# Patient Record
Sex: Female | Born: 2005 | Race: White | Hispanic: No | Marital: Single | Smoking: Never smoker
Health system: Southern US, Community
[De-identification: ages and names within clinical notes are randomized; demographics above are authoritative.]

## PROBLEM LIST (undated history)

## (undated) DIAGNOSIS — K589 Irritable bowel syndrome without diarrhea: Secondary | ICD-10-CM

## (undated) DIAGNOSIS — F909 Attention-deficit hyperactivity disorder, unspecified type: Secondary | ICD-10-CM

## (undated) DIAGNOSIS — T7840XA Allergy, unspecified, initial encounter: Secondary | ICD-10-CM

## (undated) DIAGNOSIS — G43909 Migraine, unspecified, not intractable, without status migrainosus: Secondary | ICD-10-CM

## (undated) DIAGNOSIS — R55 Syncope and collapse: Secondary | ICD-10-CM

## (undated) HISTORY — DX: Attention-deficit hyperactivity disorder, unspecified type: F90.9

## (undated) HISTORY — DX: Syncope and collapse: R55

## (undated) HISTORY — DX: Allergy, unspecified, initial encounter: T78.40XA

## (undated) HISTORY — DX: Irritable bowel syndrome, unspecified: K58.9

## (undated) HISTORY — DX: Irritable bowel syndrome without diarrhea: K58.9

## (undated) HISTORY — DX: Migraine, unspecified, not intractable, without status migrainosus: G43.909

---
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
## 2023-04-18 ENCOUNTER — Other Ambulatory Visit: Payer: Self-pay | Admitting: Family Medicine

## 2023-05-15 ENCOUNTER — Encounter: Payer: Self-pay | Admitting: Family Medicine

## 2023-05-15 ENCOUNTER — Telehealth: Payer: Managed Care, Other (non HMO) | Admitting: Family Medicine

## 2023-05-15 DIAGNOSIS — F902 Attention-deficit hyperactivity disorder, combined type: Secondary | ICD-10-CM

## 2023-05-15 MED ORDER — AMPHETAMINE-DEXTROAMPHET ER 20 MG PO CP24: 20.0000 mg | ORAL_CAPSULE | ORAL | 0 refills | Status: DC

## 2023-05-15 NOTE — Progress Notes (Signed)
Subjective:    Patient ID: Denise Williams, female    DOB: 2005-10-09, 17 y.o.   MRN: [id number]  Virtual Visit via Video Note  I connected with the patient on 05/15/23 at  1:30 PM EDT by a video enabled telemedicine application and verified that I am speaking with the correct person using two identifiers.  Location patient: home Location provider:work or home office Persons participating in the virtual visit: patient, provider  I discussed the limitations of evaluation and management by telemedicine and the availability of in person appointments. The patient expressed understanding and agreed to proceed.   HPI: (see below)    ROS: See pertinent positives and negatives per HPI.  Past Medical History:  Diagnosis Date   ADHD    Allergy    sees Dr. Verdis Frederickson at [facility] in [place]    Fainting episodes    resolved   IBS (irritable bowel syndrome)    Migraines     History reviewed. No pertinent surgical history.  Family History  Problem Relation Age of Onset   Asthma Other    Coronary artery disease Other    Depression Other    Diabetes Other    Hypertension Other    Allergies Other    Alcohol abuse Other    Dementia Maternal Grandfather      Current Outpatient Medications:    amphetamine-dextroamphetamine (ADDERALL XR) 20 MG 24 hr capsule, Take 1 capsule (20 mg total) by mouth every morning., Disp: 30 capsule, Rfl: 0   amphetamine-dextroamphetamine (ADDERALL XR) 20 MG 24 hr capsule, Take 1 capsule (20 mg total) by mouth every morning., Disp: 30 capsule, Rfl: 0   amphetamine-dextroamphetamine (ADDERALL XR) 20 MG 24 hr capsule, Take 1 capsule (20 mg total) by mouth every morning., Disp: 30 capsule, Rfl: 0   amitriptyline (ELAVIL) 50 MG tablet, TAKE 0.5 TABLETS BY MOUTH AT BEDTIME., Disp: 45 tablet, Rfl: 0   SUMAtriptan (IMITREX) 100 MG tablet, TAKE 1 TABLET (100 MG TOTAL) BY MOUTH AS NEEDED FOR MIGRAINE. MAY REPEAT IN 2 HOURS IF HEADACHE PERSISTS OR  RECURS., Disp: 9 tablet, Rfl: 6  EXAM:  VITALS per patient if applicable:  GENERAL: alert, oriented, appears well and in no acute distress  HEENT: atraumatic, conjunttiva clear, no obvious abnormalities on inspection of external nose and ears  NECK: normal movements of the head and neck  LUNGS: on inspection no signs of respiratory distress, breathing rate appears normal, no obvious gross SOB, gasping or wheezing  CV: no obvious cyanosis  MS: moves all visible extremities without noticeable abnormality  PSYCH/NEURO: pleasant and cooperative, no obvious depression or anxiety, speech and thought processing grossly intact  ASSESSMENT AND PLAN:  Discussed the following assessment and plan:  No diagnosis found.     I discussed the assessment and treatment plan with the patient. The patient was provided an opportunity to ask questions and all were answered. The patient agreed with the plan and demonstrated an understanding of the instructions.   The patient was advised to call back or seek an in-person evaluation if the symptoms worsen or if the condition fails to improve as anticipated.    HPI:  Here asking for help with her ADHD. 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 Her work load is much greater than it was in high school. When she was much younger she took Adderall XR for a few years, and this worked quite well for her.  Review of Systems  Constitutional: Negative.   Respiratory: Negative.    Cardiovascular: Negative.   Psychiatric/Behavioral:  Positive for decreased concentration. Negative for agitation and dysphoric mood. The patient is not nervous/anxious.        Objective:   Physical Exam Constitutional:      Appearance: Normal appearance.  Neurological:     Mental Status: She is alert.           Assessment & Plan:  ADHD. She will try Adderall XR 20 mg every morning.  Gershon Crane, MD

## 2023-07-07 ENCOUNTER — Other Ambulatory Visit: Payer: Self-pay | Admitting: Family Medicine

## 2023-07-11 ENCOUNTER — Ambulatory Visit: Payer: Managed Care, Other (non HMO) | Admitting: Family Medicine

## 2023-07-12 ENCOUNTER — Encounter: Payer: Self-pay | Admitting: Family Medicine

## 2023-07-12 ENCOUNTER — Ambulatory Visit: Payer: Managed Care, Other (non HMO) | Admitting: Family Medicine

## 2023-07-12 VITALS — BP 98/68 | HR 85 | Temp 98.3°F | Wt 150.0 lb

## 2023-07-12 DIAGNOSIS — F902 Attention-deficit hyperactivity disorder, combined type: Secondary | ICD-10-CM | POA: Diagnosis not present

## 2023-07-12 MED ORDER — AMPHETAMINE-DEXTROAMPHET ER 20 MG PO CP24: 20.0000 mg | ORAL_CAPSULE | ORAL | 0 refills | Status: DC

## 2023-07-12 MED ORDER — SUMATRIPTAN SUCCINATE 100 MG PO TABS: 100.0000 mg | ORAL_TABLET | ORAL | 11 refills | Status: AC | PRN

## 2023-07-12 NOTE — Progress Notes (Signed)
   Subjective:    Patient ID: Denise Williams, female    DOB: 29-Mar-2006, 17 y.o.   MRN: [id number]  HPI Here to follow up on ADHD. She is home on winter break from college. She has been taking Adderal XR, and this has been working well for her. She can focus on her work better, and she has no side effects. So far she is getting "straight A's".    Review of Systems  Constitutional: Negative.   Respiratory: Negative.    Cardiovascular: Negative.   Neurological: Negative.        Objective:   Physical Exam Constitutional:      Appearance: Normal appearance.  Cardiovascular:     Rate and Rhythm: Normal rate and regular rhythm.     Pulses: Normal pulses.     Heart sounds: Normal heart sounds.  Pulmonary:     Effort: Pulmonary effort is normal.     Breath sounds: Normal breath sounds.  Neurological:     Mental Status: She is alert.           Assessment & Plan:  ADHD, well controlled. She will stay on the current regimen. Gershon Crane, MD

## 2023-07-28 ENCOUNTER — Other Ambulatory Visit: Payer: Self-pay | Admitting: Family Medicine

## 2023-07-30 MED ORDER — AMITRIPTYLINE HCL 50 MG PO TABS: 50.0000 mg | ORAL_TABLET | Freq: Every day | ORAL | 0 refills | Status: DC

## 2023-09-23 ENCOUNTER — Other Ambulatory Visit: Payer: Self-pay | Admitting: Family Medicine

## 2023-09-24 MED ORDER — AMPHETAMINE-DEXTROAMPHET ER 20 MG PO CP24: 20.0000 mg | ORAL_CAPSULE | ORAL | 0 refills | Status: DC

## 2023-09-24 NOTE — Telephone Encounter (Signed)
 Done

## 2023-11-11 ENCOUNTER — Other Ambulatory Visit: Payer: Self-pay | Admitting: Family Medicine

## 2023-11-18 ENCOUNTER — Other Ambulatory Visit: Payer: Self-pay | Admitting: Family Medicine

## 2023-11-19 MED ORDER — AMITRIPTYLINE HCL 25 MG PO TABS: 25.0000 mg | ORAL_TABLET | Freq: Every day | ORAL | 3 refills | Status: AC

## 2023-11-19 NOTE — Telephone Encounter (Signed)
 Done

## 2024-04-06 ENCOUNTER — Other Ambulatory Visit: Payer: Self-pay | Admitting: Family Medicine

## 2024-04-06 NOTE — Telephone Encounter (Unsigned)
 Copied from CRM [id number]. Topic: Clinical - Medication Refill >> Apr 06, 2024 11:25 AM Thersia C wrote: Medication: amphetamine -dextroamphetamine (ADDERALL XR) 20 MG 24 hr capsule  Has the patient contacted their pharmacy? Yes (Agent: If no, request that the patient contact the pharmacy for the refill. If patient does not wish to contact the pharmacy document the reason why and proceed with request.) (Agent: If yes, when and what did the pharmacy advise?)  This is the patient's preferred pharmacy:  [facility] - [place], [place] - [address] [place] [address] Phone: [phone] Fax: [phone]  Is this the correct pharmacy for this prescription? Yes If no, delete pharmacy and type the correct one.   Has the prescription been filled recently? No  Is the patient out of the medication? Yes  Has the patient been seen for an appointment in the last year OR does the patient have an upcoming appointment? Yes  Can we respond through MyChart? Yes  Agent: Please be advised that Rx refills may take up to 3 business days. We ask that you follow-up with your pharmacy.

## 2024-04-06 NOTE — Telephone Encounter (Signed)
 09/24/23 x 3 30 tabs/0 RF

## 2024-04-09 MED ORDER — AMPHETAMINE-DEXTROAMPHET ER 20 MG PO CP24: 20.0000 mg | ORAL_CAPSULE | ORAL | 0 refills | Status: DC

## 2024-04-09 NOTE — Telephone Encounter (Signed)
 Done

## 2024-06-20 ENCOUNTER — Encounter: Payer: Self-pay | Admitting: Family Medicine

## 2024-06-23 MED ORDER — AMPHETAMINE-DEXTROAMPHET ER 20 MG PO CP24: 20.0000 mg | ORAL_CAPSULE | ORAL | 0 refills | Status: AC

## 2024-06-23 NOTE — Telephone Encounter (Signed)
 Done

## 2024-07-27 ENCOUNTER — Telehealth: Payer: Self-pay | Admitting: Family Medicine

## 2024-07-27 NOTE — Telephone Encounter (Unsigned)
 Copied from CRM #[id number]. Topic: Clinical - Refused Triage >> Jul 27, 2024 12:40 PM Jayma L wrote:   ----------------------------------------------------------------------- From previous Reason for Contact - Refused Triage: Patient/caller voiced complaints of stomach pain . Declined transfer to triage.   Said she was told to call and ask for nurse triage due to stomach pain but she doesn't want seen for that she just needs a physical. Advised she was already scheduled. Best number is [phone]

## 2024-07-28 NOTE — Telephone Encounter (Signed)
 Noted

## 2024-08-03 ENCOUNTER — Encounter: Payer: Self-pay | Admitting: Family Medicine

## 2024-08-04 ENCOUNTER — Encounter: Payer: Self-pay | Admitting: Family Medicine

## 2024-08-04 ENCOUNTER — Ambulatory Visit: Admitting: Family Medicine

## 2024-08-04 VITALS — BP 114/70 | HR 118 | Temp 98.6°F | Ht 60.5 in | Wt 148.0 lb

## 2024-08-04 DIAGNOSIS — R051 Acute cough: Secondary | ICD-10-CM

## 2024-08-04 MED ORDER — PROMETHAZINE-DM 6.25-15 MG/5ML PO SYRP: 5.0000 mL | ORAL_SOLUTION | Freq: Four times a day (QID) | ORAL | 0 refills | Status: AC | PRN

## 2024-08-04 MED ORDER — PREDNISONE 10 MG PO TABS: ORAL_TABLET | ORAL | 0 refills | Status: AC

## 2024-08-04 NOTE — Patient Instructions (Signed)
-  It was a pleasure to care for you today. -Suspect you had viral infection, such as flu, and developed this lingering cough.  -Prescribed Promethazine -DM cough syrup. Caution: Medication can cause drowsiness. -Prescribed Prednisone  10mg , 6 day taper for cough. Recommend to not take NSAIDS (Ibuprofen, Advil, Aleve, and Naproxen) while taking medication and eat something with medication.  -Follow up if not improved.

## 2024-08-04 NOTE — Progress Notes (Signed)
 "  Acute Office Visit   Subjective:  Patient ID: Denise Williams, female    DOB: March 18, 2006, 18 y.o.   MRN: [id number]  Chief Complaint  Patient presents with   Cough    HPI:  Patient is present for an acute visit. Her primary care provider is Dr. Garnette Olmsted.  Patient is complaining of a lingering cough that started since last Saturday. Cough was initially dry and become productive. Phlegm is thick green. Pain in chest related to cough and no SHOB. Denies fever.   On last Saturday she had sore throat, body aches, headaches, and fatigue. These symptoms have resolved.   She has been taking OTC cough syrup and cough drops with the first few days, but not worked the last several days.   Review of Systems  Respiratory:  Positive for cough.    See HPI above      Objective:   BP 114/70   Pulse (!) 118   Temp 98.6 F (37 C) (Oral)   Ht 5' 0.5 (1.537 m)   Wt 148 lb (67.1 kg)   SpO2 94%   BMI 28.43 kg/m    Physical Exam Vitals reviewed.  Constitutional:      General: She is not in acute distress.    Appearance: Normal appearance. She is not ill-appearing, toxic-appearing or diaphoretic.  HENT:     Head: Normocephalic and atraumatic.     Right Ear: Tympanic membrane, ear canal and external ear normal. There is no impacted cerumen.     Left Ear: Tympanic membrane, ear canal and external ear normal. There is no impacted cerumen.     Nose:     Right Sinus: No maxillary sinus tenderness or frontal sinus tenderness.     Left Sinus: No maxillary sinus tenderness or frontal sinus tenderness.     Mouth/Throat:     Pharynx: Oropharynx is clear. Uvula midline. No pharyngeal swelling, oropharyngeal exudate, posterior oropharyngeal erythema or uvula swelling.  Eyes:     General:        Right eye: No discharge.        Left eye: No discharge.     Conjunctiva/sclera: Conjunctivae normal.  Cardiovascular:     Rate and Rhythm: Normal rate and regular rhythm.     Heart sounds: Normal  heart sounds. No murmur heard.    No friction rub. No gallop.  Pulmonary:     Effort: Pulmonary effort is normal. No respiratory distress.     Breath sounds: Normal breath sounds.     Comments: Non productive during visit  Musculoskeletal:        General: Normal range of motion.  Skin:    General: Skin is warm and dry.  Neurological:     General: No focal deficit present.     Mental Status: She is alert and oriented to person, place, and time. Mental status is at baseline.  Psychiatric:        Mood and Affect: Mood normal.        Behavior: Behavior normal.        Thought Content: Thought content normal.        Judgment: Judgment normal.       Assessment & Plan:  Acute cough -     Promethazine -DM; Take 5 mLs by mouth 4 (four) times daily as needed.  Dispense: 118 mL; Refill: 0 -     predniSONE ; Take 6 tablets (60 mg total) by mouth daily with breakfast for 1 day, THEN 5  tablets (50 mg total) daily with breakfast for 1 day, THEN 4 tablets (40 mg total) daily with breakfast for 1 day, THEN 3 tablets (30 mg total) daily with breakfast for 1 day, THEN 2 tablets (20 mg total) daily with breakfast for 1 day, THEN 1 tablet (10 mg total) daily with breakfast for 1 day.  Dispense: 21 tablet; Refill: 0  -Suspect a viral infection, such as flu, and developed this lingering cough.  -Prescribed Promethazine -DM cough syrup. Caution: Medication can cause drowsiness. -Prescribed Prednisone  10mg , 6 day taper for cough. Recommend to not take NSAIDS (Ibuprofen, Advil, Aleve, and Naproxen) while taking medication and eat something with medication.  -Follow up if not improved.   Demontay Grantham, NP "

## 2024-08-08 ENCOUNTER — Ambulatory Visit
Admission: RE | Admit: 2024-08-08 | Discharge: 2024-08-08 | Disposition: A | Discharge status: Home / Self Care | Source: Ambulatory Visit | Attending: Family Medicine | Admitting: Family Medicine

## 2024-08-08 ENCOUNTER — Other Ambulatory Visit: Payer: Self-pay

## 2024-08-08 VITALS — BP 132/88 | HR 127 | Temp 98.4°F | Resp 18

## 2024-08-08 DIAGNOSIS — J209 Acute bronchitis, unspecified: Secondary | ICD-10-CM | POA: Diagnosis not present

## 2024-08-08 MED ORDER — DOXYCYCLINE HYCLATE 100 MG PO CAPS: 100.0000 mg | ORAL_CAPSULE | Freq: Two times a day (BID) | ORAL | 0 refills | Status: AC

## 2024-08-08 MED ORDER — BENZONATATE 200 MG PO CAPS: 200.0000 mg | ORAL_CAPSULE | Freq: Three times a day (TID) | ORAL | 0 refills | Status: AC | PRN

## 2024-08-08 NOTE — ED Provider Notes (Signed)
 " [facility]    CSN: [id number] Arrival date & time: 08/08/24  1110      History   Chief Complaint Chief Complaint  Patient presents with   Cough    HPI Denise Williams is a 19 y.o. female.   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  Was seen last week for an upper respiratory infection.  Diagnosed with a virus.  She is here because she continues to cough.  Mother states she has a harsh cough.  She has been coughing for 3 weeks.  She is about to go back to college tomorrow and mother would like for her to have an antibiotic.  Mother is an [employer].  No underlying asthma or lung disease.  No shortness of breath or chest pain  Past Medical History:  Diagnosis Date   ADHD    Allergy    sees Dr. Lennart Eastern at [facility] in [place]    Fainting episodes    resolved   IBS (irritable bowel syndrome)    Migraines     Patient Active Problem List   Diagnosis Date Noted   Syncope 05/26/2020   Anxiety disorder 09/01/2019   Constipation 07/13/2019   IBS (irritable bowel syndrome) 04/29/2019   ADHD (attention deficit hyperactivity disorder), combined type 09/30/2014    History reviewed. No pertinent surgical history.  OB History   No obstetric history on file.      Home Medications    Prior to Admission medications  Medication Sig Start Date End Date Taking? Authorizing Provider  benzonatate  (TESSALON ) 200 MG capsule Take 1 capsule (200 mg total) by mouth 3 (three) times daily as needed for cough. 08/08/24  Yes Maranda Jamee Jacob, MD  doxycycline  (VIBRAMYCIN ) 100 MG capsule Take 1 capsule (100 mg total) by mouth 2 (two) times daily. 08/08/24  Yes Maranda Jamee Jacob, MD  amitriptyline  (ELAVIL ) 25 MG tablet Take 1 tablet (25 mg total) by mouth at bedtime. 11/19/23   Johnny Garnette LABOR, MD  amphetamine -dextroamphetamine (ADDERALL XR) 20 MG 24 hr capsule Take 1 capsule (20 mg total) by mouth every morning. 06/23/24   Johnny Garnette LABOR, MD   amphetamine -dextroamphetamine (ADDERALL XR) 20 MG 24 hr capsule Take 1 capsule (20 mg total) by mouth every morning. 06/23/24   Johnny Garnette LABOR, MD  amphetamine -dextroamphetamine (ADDERALL XR) 20 MG 24 hr capsule Take 1 capsule (20 mg total) by mouth every morning. 06/23/24   Johnny Garnette LABOR, MD  predniSONE  (DELTASONE ) 10 MG tablet Take 6 tablets (60 mg total) by mouth daily with breakfast for 1 day, THEN 5 tablets (50 mg total) daily with breakfast for 1 day, THEN 4 tablets (40 mg total) daily with breakfast for 1 day, THEN 3 tablets (30 mg total) daily with breakfast for 1 day, THEN 2 tablets (20 mg total) daily with breakfast for 1 day, THEN 1 tablet (10 mg total) daily with breakfast for 1 day. 08/04/24 08/10/24  Billy Philippe SAUNDERS, NP  promethazine -dextromethorphan (PROMETHAZINE -DM) 6.25-15 MG/5ML syrup Take 5 mLs by mouth 4 (four) times daily as needed. 08/04/24   Billy Philippe SAUNDERS, NP  SUMAtriptan  (IMITREX ) 100 MG tablet Take 1 tablet (100 mg total) by mouth as needed for migraine. May repeat in 2 hours if headache persists or recurs. 07/12/23   Johnny Garnette LABOR, MD    Family History Family History  Problem Relation Age of Onset   Asthma Other    Coronary artery disease Other  Depression Other    Diabetes Other    Hypertension Other    Allergies Other    Alcohol abuse Other    Dementia Maternal Grandfather     Social History Social History[1]   Allergies   Hydromet [hydrocodone  bit-homatrop mbr] and Codeine   Review of Systems Review of Systems See HPI  Physical Exam Triage Vital Signs ED Triage Vitals  Encounter Vitals Group     BP 08/08/24 1119 132/88     Girls Systolic BP Percentile --      Girls Diastolic BP Percentile --      Boys Systolic BP Percentile --      Boys Diastolic BP Percentile --      Pulse Rate 08/08/24 1119 (!) 127     Resp 08/08/24 1119 18     Temp 08/08/24 1119 98.4 F (36.9 C)     Temp Source 08/08/24 1119 Oral     SpO2 08/08/24 1119 99 %      Weight --      Height --      Head Circumference --      Peak Flow --      Pain Score 08/08/24 1120 0     Pain Loc --      Pain Education --      Exclude from Growth Chart --    No data found.  Updated Vital Signs BP 132/88 (BP Location: Right Arm)   Pulse (!) 127   Temp 98.4 F (36.9 C) (Oral)   Resp 18   LMP  (LMP Unknown)   SpO2 99%      Physical Exam Constitutional:      General: She is not in acute distress.    Appearance: She is well-developed. She is not ill-appearing.  HENT:     Head: Normocephalic and atraumatic.     Right Ear: Tympanic membrane normal.     Left Ear: Tympanic membrane normal.     Nose: Nose normal. No congestion.     Mouth/Throat:     Mouth: Mucous membranes are moist.     Pharynx: No posterior oropharyngeal erythema.  Eyes:     Conjunctiva/sclera: Conjunctivae normal.     Pupils: Pupils are equal, round, and reactive to light.  Cardiovascular:     Rate and Rhythm: Normal rate and regular rhythm.     Heart sounds: Normal heart sounds.  Pulmonary:     Effort: Pulmonary effort is normal. No respiratory distress.     Breath sounds: Normal breath sounds.  Musculoskeletal:        General: Normal range of motion.     Cervical back: Normal range of motion.  Skin:    General: Skin is warm and dry.  Neurological:     Mental Status: She is alert.      UC Treatments / Results  Labs (all labs ordered are listed, but only abnormal results are displayed) Labs Reviewed - No data to display  EKG   Radiology No results found.  Procedures Procedures (including critical care time)  Medications Ordered in UC Medications - No data to display  Initial Impression / Assessment and Plan / UC Course  I have reviewed the triage vital signs and the nursing notes.  Pertinent labs & imaging results that were available during my care of the patient were reviewed by me and considered in my medical decision making (see chart for details).     I  think with a cough for 3 weeks antibiotics  are reasonable. Final Clinical Impressions(s) / UC Diagnoses   Final diagnoses:  Acute bronchitis, unspecified organism     Discharge Instructions      Take the doxycycline  antibiotic 2 times a day.  Take this antibiotic with food May take Tessalon  3 times a day as needed for cough.  May take in addition Delsym in the morning and your Phenergan  DM at night Drink lots of fluids Call for problems   ED Prescriptions     Medication Sig Dispense Auth. Provider   doxycycline  (VIBRAMYCIN ) 100 MG capsule Take 1 capsule (100 mg total) by mouth 2 (two) times daily. 20 capsule Maranda Jamee Jacob, MD   benzonatate  (TESSALON ) 200 MG capsule Take 1 capsule (200 mg total) by mouth 3 (three) times daily as needed for cough. 21 capsule Maranda Jamee Jacob, MD      PDMP not reviewed this encounter.    [1]  Social History Tobacco Use   Smoking status: Never   Smokeless tobacco: Never     Maranda Jamee Jacob, MD 08/08/24 1219  "

## 2024-08-08 NOTE — ED Triage Notes (Signed)
 Pt c/o continued cough since 12/27. Suspects she had flu. Saw PCP last Tues. Was rx'd steroids with no relief. Also using a rx cough syrup at night. No recent fevers.

## 2024-08-08 NOTE — Discharge Instructions (Signed)
 Take the doxycycline  antibiotic 2 times a day.  Take this antibiotic with food May take Tessalon  3 times a day as needed for cough.  May take in addition Delsym in the morning and your Phenergan  DM at night Drink lots of fluids Call for problems
